# Patient Record
Sex: Female | Born: 2000 | Marital: Single | Smoking: Never smoker
Health system: Southern US, Community
[De-identification: ages and names within clinical notes are randomized; demographics above are authoritative.]

## PROBLEM LIST (undated history)

## (undated) HISTORY — PX: WISDOM TOOTH EXTRACTION: SHX21

## (undated) HISTORY — PX: MYRINGOTOMY: SUR874

## (undated) HISTORY — PX: ADENOIDECTOMY: SHX5191

---
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
## 2019-03-14 ENCOUNTER — Other Ambulatory Visit: Payer: Self-pay

## 2019-03-14 DIAGNOSIS — Z20822 Contact with and (suspected) exposure to covid-19: Secondary | ICD-10-CM

## 2019-03-16 LAB — NOVEL CORONAVIRUS, NAA: SARS-CoV-2, NAA: NOT DETECTED
